# Patient Record
Sex: Female | Born: 1935 | Race: White | Hispanic: No | Marital: Married | Smoking: Never smoker
Health system: Southern US, Community
[De-identification: ages and names within clinical notes are randomized; demographics above are authoritative.]

## PROBLEM LIST (undated history)

## (undated) DIAGNOSIS — M549 Dorsalgia, unspecified: Secondary | ICD-10-CM

## (undated) DIAGNOSIS — C50919 Malignant neoplasm of unspecified site of unspecified female breast: Secondary | ICD-10-CM

## (undated) HISTORY — PX: PAIN PUMP IMPLANTATION: SHX330

## (undated) HISTORY — PX: APPENDECTOMY: SHX54

## (undated) HISTORY — PX: FOOT SURGERY: SHX648

## (undated) HISTORY — PX: PAIN PUMP REMOVAL: SHX6391

## (undated) HISTORY — PX: BREAST LUMPECTOMY: SHX2

---

## 2002-09-19 ENCOUNTER — Encounter: Admission: RE | Admit: 2002-09-19 | Discharge: 2002-09-19 | Payer: Self-pay | Admitting: Internal Medicine

## 2013-02-28 ENCOUNTER — Encounter (HOSPITAL_BASED_OUTPATIENT_CLINIC_OR_DEPARTMENT_OTHER): Payer: Self-pay | Admitting: Emergency Medicine

## 2013-02-28 ENCOUNTER — Emergency Department (HOSPITAL_BASED_OUTPATIENT_CLINIC_OR_DEPARTMENT_OTHER)
Admission: EM | Admit: 2013-02-28 | Discharge: 2013-02-28 | Disposition: A | Discharge status: Home / Self Care | Payer: Medicare Other | Attending: Emergency Medicine | Admitting: Emergency Medicine

## 2013-02-28 ENCOUNTER — Emergency Department (HOSPITAL_BASED_OUTPATIENT_CLINIC_OR_DEPARTMENT_OTHER): Payer: Medicare Other

## 2013-02-28 DIAGNOSIS — R1013 Epigastric pain: Secondary | ICD-10-CM | POA: Insufficient documentation

## 2013-02-28 DIAGNOSIS — M545 Low back pain, unspecified: Secondary | ICD-10-CM | POA: Insufficient documentation

## 2013-02-28 DIAGNOSIS — Z880 Allergy status to penicillin: Secondary | ICD-10-CM | POA: Insufficient documentation

## 2013-02-28 DIAGNOSIS — R1031 Right lower quadrant pain: Secondary | ICD-10-CM | POA: Insufficient documentation

## 2013-02-28 DIAGNOSIS — G8929 Other chronic pain: Secondary | ICD-10-CM | POA: Insufficient documentation

## 2013-02-28 DIAGNOSIS — Z79899 Other long term (current) drug therapy: Secondary | ICD-10-CM | POA: Diagnosis not present

## 2013-02-28 DIAGNOSIS — Z87828 Personal history of other (healed) physical injury and trauma: Secondary | ICD-10-CM | POA: Diagnosis not present

## 2013-02-28 DIAGNOSIS — Z792 Long term (current) use of antibiotics: Secondary | ICD-10-CM | POA: Diagnosis not present

## 2013-02-28 DIAGNOSIS — Z853 Personal history of malignant neoplasm of breast: Secondary | ICD-10-CM | POA: Insufficient documentation

## 2013-02-28 DIAGNOSIS — M549 Dorsalgia, unspecified: Secondary | ICD-10-CM

## 2013-02-28 DIAGNOSIS — K4090 Unilateral inguinal hernia, without obstruction or gangrene, not specified as recurrent: Secondary | ICD-10-CM | POA: Diagnosis not present

## 2013-02-28 DIAGNOSIS — R52 Pain, unspecified: Secondary | ICD-10-CM | POA: Insufficient documentation

## 2013-02-28 HISTORY — DX: Malignant neoplasm of unspecified site of unspecified female breast: C50.919

## 2013-02-28 HISTORY — DX: Dorsalgia, unspecified: M54.9

## 2013-02-28 LAB — CBC
HCT: 39.5 % (ref 36.0–46.0)
MCH: 30.2 pg (ref 26.0–34.0)
MCHC: 32.9 g/dL (ref 30.0–36.0)
Platelets: 158 10*3/uL (ref 150–400)
RDW: 13.5 % (ref 11.5–15.5)

## 2013-02-28 LAB — BASIC METABOLIC PANEL
BUN: 17 mg/dL (ref 6–23)
Creatinine, Ser: 0.7 mg/dL (ref 0.50–1.10)
GFR calc Af Amer: 90 mL/min — ABNORMAL LOW (ref >90)
GFR calc non Af Amer: 77 mL/min — ABNORMAL LOW (ref >90)
Glucose, Bld: 97 mg/dL (ref 70–99)
Potassium: 4.1 mEq/L (ref 3.5–5.1)
Sodium: 142 mEq/L (ref 135–145)

## 2013-02-28 LAB — HEPATIC FUNCTION PANEL
Albumin: 3.6 g/dL (ref 3.5–5.2)
Alkaline Phosphatase: 87 U/L (ref 39–117)
Total Bilirubin: 0.4 mg/dL (ref 0.3–1.2)
Total Protein: 7.1 g/dL (ref 6.0–8.3)

## 2013-02-28 LAB — URINE MICROSCOPIC-ADD ON

## 2013-02-28 LAB — URINALYSIS, ROUTINE W REFLEX MICROSCOPIC
Glucose, UA: NEGATIVE mg/dL
Leukocytes, UA: NEGATIVE
Protein, ur: NEGATIVE mg/dL
Specific Gravity, Urine: 1.018 (ref 1.005–1.030)
pH: 5.5 (ref 5.0–8.0)

## 2013-02-28 LAB — LIPASE, BLOOD: Lipase: 44 U/L (ref 11–59)

## 2013-02-28 MED ORDER — KETOROLAC TROMETHAMINE 30 MG/ML IJ SOLN
30.0000 mg | Freq: Once | INTRAMUSCULAR | Status: AC
  Administered 2013-02-28: 30 mg via INTRAVENOUS
  Filled 2013-02-28: qty 1

## 2013-02-28 MED ORDER — AZITHROMYCIN 250 MG PO TABS: ORAL_TABLET | ORAL | Status: AC

## 2013-02-28 MED ORDER — ETODOLAC 200 MG PO CAPS: 200.0000 mg | ORAL_CAPSULE | Freq: Three times a day (TID) | ORAL | Status: AC

## 2013-02-28 NOTE — ED Notes (Signed)
Patient took two advil last night & nothing this morning
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
## 2013-02-28 NOTE — ED Notes (Signed)
Patient refuses EKG at this time.  RN made aware.

## 2013-03-01 LAB — URINE CULTURE: Culture: NO GROWTH

## 2016-09-10 ENCOUNTER — Emergency Department (HOSPITAL_BASED_OUTPATIENT_CLINIC_OR_DEPARTMENT_OTHER): Payer: Medicare Other

## 2016-09-10 ENCOUNTER — Encounter (HOSPITAL_BASED_OUTPATIENT_CLINIC_OR_DEPARTMENT_OTHER): Payer: Self-pay | Admitting: Emergency Medicine

## 2016-09-10 ENCOUNTER — Emergency Department (HOSPITAL_BASED_OUTPATIENT_CLINIC_OR_DEPARTMENT_OTHER)
Admission: EM | Admit: 2016-09-10 | Discharge: 2016-09-10 | Disposition: A | Discharge status: Home / Self Care | Payer: Medicare Other | Attending: Emergency Medicine | Admitting: Emergency Medicine

## 2016-09-10 DIAGNOSIS — Y939 Activity, unspecified: Secondary | ICD-10-CM | POA: Insufficient documentation

## 2016-09-10 DIAGNOSIS — S60911A Unspecified superficial injury of right wrist, initial encounter: Secondary | ICD-10-CM | POA: Diagnosis present

## 2016-09-10 DIAGNOSIS — S61501A Unspecified open wound of right wrist, initial encounter: Secondary | ICD-10-CM | POA: Insufficient documentation

## 2016-09-10 DIAGNOSIS — Y999 Unspecified external cause status: Secondary | ICD-10-CM | POA: Diagnosis not present

## 2016-09-10 DIAGNOSIS — Z79899 Other long term (current) drug therapy: Secondary | ICD-10-CM | POA: Insufficient documentation

## 2016-09-10 DIAGNOSIS — Z23 Encounter for immunization: Secondary | ICD-10-CM | POA: Diagnosis not present

## 2016-09-10 DIAGNOSIS — Z853 Personal history of malignant neoplasm of breast: Secondary | ICD-10-CM | POA: Diagnosis not present

## 2016-09-10 DIAGNOSIS — S61511A Laceration without foreign body of right wrist, initial encounter: Secondary | ICD-10-CM

## 2016-09-10 DIAGNOSIS — W010XXA Fall on same level from slipping, tripping and stumbling without subsequent striking against object, initial encounter: Secondary | ICD-10-CM | POA: Insufficient documentation

## 2016-09-10 DIAGNOSIS — S39012A Strain of muscle, fascia and tendon of lower back, initial encounter: Secondary | ICD-10-CM | POA: Diagnosis not present

## 2016-09-10 DIAGNOSIS — W19XXXA Unspecified fall, initial encounter: Secondary | ICD-10-CM

## 2016-09-10 DIAGNOSIS — Y92010 Kitchen of single-family (private) house as the place of occurrence of the external cause: Secondary | ICD-10-CM | POA: Diagnosis not present

## 2016-09-10 MED ORDER — PENTAFLUOROPROP-TETRAFLUOROETH EX AERO
INHALATION_SPRAY | CUTANEOUS | Status: AC
  Administered 2016-09-10: 30
  Filled 2016-09-10: qty 30

## 2016-09-10 MED ORDER — TETANUS-DIPHTH-ACELL PERTUSSIS 5-2.5-18.5 LF-MCG/0.5 IM SUSP
0.5000 mL | Freq: Once | INTRAMUSCULAR | Status: AC
  Administered 2016-09-10: 0.5 mL via INTRAMUSCULAR
  Filled 2016-09-10: qty 0.5

## 2016-09-10 NOTE — ED Provider Notes (Signed)
[facility] [facility] Provider Note   CSN: [id number] Arrival date & time: 09/10/16  1044     History   Chief Complaint Chief Complaint  Patient presents with  . Fall    HPI Betty Wood is a 83 y.o. female.   Patient with fall at home as stumbled did not pass out. Did not hit her head. Fell on the tile floor in the kitchen. Complaint of back pain and a skin tear to her right wrist area. Also some right wrist pain. Patient has a history of prior compression fractures to the back. Tetanus is not up-to-date. Patient does have a walker at home to use.      Past Medical History:  Diagnosis Date  . Back pain   . Breast cancer ([place])     There are no active problems to display for this patient.   Past Surgical History:  Procedure Laterality Date  . APPENDECTOMY    . BREAST LUMPECTOMY    . FOOT SURGERY    . PAIN PUMP IMPLANTATION    . PAIN PUMP REMOVAL      OB History    No data available       Home Medications    Prior to Admission medications   Medication Sig Start Date End Date Taking? Authorizing Provider  Alum & Mag Hydroxide-Simeth (MAGIC MOUTHWASH) SOLN Take 5 mLs by mouth.    [provider]  azithromycin (ZITHROMAX Z-PAK) 250 MG tablet 2 po day one, then 1 daily x 4 days 02/28/13   Evelina Bucy, MD  Calcium Carbonate-Vitamin D (CALCIUM-D PO) Take by mouth.    [provider]  estrogens, conjugated, (PREMARIN) 0.3 MG tablet Take 0.3 mg by mouth daily. Take daily for 21 days then do not take for 7 days.    [provider]  etodolac (LODINE) 200 MG capsule Take 1 capsule (200 mg total) by mouth 3 (three) times daily. 02/28/13   Evelina Bucy, MD  levothyroxine (SYNTHROID, LEVOTHROID) 50 MCG tablet Take 50 mcg by mouth daily before breakfast.    [provider]  nystatin 100000 UNITS vaginal tablet Place 1 tablet vaginally at bedtime.    [provider]  NYSTATIN PO Take by mouth.    [provider]    omeprazole (PRILOSEC) 20 MG capsule Take 20 mg by mouth daily.    [provider]  oxaprozin (DAYPRO) 600 MG tablet Take by mouth daily.    [provider]  oxybutynin (DITROPAN-XL) 10 MG 24 hr tablet Take 10 mg by mouth at bedtime.    [provider]  tobramycin (TOBREX) 0.3 % ophthalmic ointment 1 application 3 (three) times daily.    [provider]  vitamin B-12 (CYANOCOBALAMIN) 1000 MCG tablet Take 1,000 mcg by mouth daily.    [provider]  vitamin E 1000 UNIT capsule Take 1,000 Units by mouth daily.    [provider]    Family History No family history on file.  Social History Social History  Substance Use Topics  . Smoking status: Never Smoker  . Smokeless tobacco: Never Used  . Alcohol use No     Allergies   Bextra [valdecoxib]; Chloromycetin [chloramphenicol]; Clindamycin/lincomycin; Decadrol [dexamethasone]; Dilaudid [hydromorphone hcl]; Dimetapp cold-allergy [brompheniramine-phenylephrine]; Doxycycline; Erythromycin; Flagyl [metronidazole]; Hydrocodone; Indomethacin; Influenza vaccines; Ivp dye [iodinated diagnostic agents]; Keflex [cephalexin]; Levaquin [levofloxacin in d5w]; Mobic [meloxicam]; Pneumovax [pneumococcal polysaccharide vaccine]; Shellfish allergy; Sulfa antibiotics; Titanium; Triaminic fever reducer [acetaminophen]; and Vioxx [rofecoxib]   Review of Systems  Review of Systems  Constitutional: Negative for fever.  HENT: Negative for congestion.   Eyes: Negative for redness.  Respiratory: Negative for shortness of breath.   Cardiovascular: Negative for chest pain.  Gastrointestinal: Negative for abdominal pain.  Genitourinary: Negative for dysuria.  Musculoskeletal: Positive for back pain. Negative for neck pain.  Skin: Positive for wound.  Neurological: Negative for headaches.  Hematological: Does not bruise/bleed easily.  Psychiatric/Behavioral: Negative for confusion.     Physical  Exam Updated Vital Signs BP (!) 147/67 (BP Location: Right Arm)   Pulse 83   Resp 17   SpO2 98%   Physical Exam  Constitutional: She is oriented to person, place, and time. She appears well-developed and well-nourished.  HENT:  Head: Normocephalic and atraumatic.  Mouth/Throat: Oropharynx is clear and moist.  Eyes: Conjunctivae and EOM are normal. Pupils are equal, round, and reactive to light.  Neck: Normal range of motion. Neck supple.  Cardiovascular: Normal rate, regular rhythm and normal heart sounds.   Pulmonary/Chest: Effort normal and breath sounds normal. No respiratory distress.  Abdominal: Soft. Bowel sounds are normal. There is no tenderness.  Musculoskeletal: Normal range of motion. She exhibits tenderness.  No tenderness with range of motion of legs or palpation of the hips or pelvis. But that does cause some back pain. Right wrist with a 4 x 3 cm skin tear. Some tenderness to the wrist area. No elbow or shoulder tenderness. Cap refill normal. Radial pulses 2+.  Neurological: She is alert and oriented to person, place, and time. No cranial nerve deficit or sensory deficit. She exhibits normal muscle tone. Coordination normal.  Skin: Skin is warm.  Nursing note and vitals reviewed.    ED Treatments / Results  Labs (all labs ordered are listed, but only abnormal results are displayed) Labs Reviewed - No data to display  EKG  EKG Interpretation None       Radiology Dg Wrist Complete Right  Result Date: 09/10/2016 CLINICAL DATA:  Status post fall last night.  Right wrist pain. EXAM: RIGHT WRIST - COMPLETE 3+ VIEW COMPARISON:  None. FINDINGS: Severe osteopenia. No acute fracture or dislocation. Mild radiocarpal joint space narrowing. Severe osteoarthritis of the scaphotrapeziotrapezoid joint. Severe joint space narrowing of the lunocapitate joint. Chondrocalcinosis of the TFCC as can be seen with CPPD. IMPRESSION: 1. No acute osseous injury of the right wrist. Given  the patient's age and osteopenia, if there is persistent clinical concern for an occult fracture, a follow-up wrist x-ray and 7-10 days or MRI is recommended for increased sensitivity. Electronically Signed   By: Kathreen Devoid   On: 09/10/2016 12:34   Ct Lumbar Spine Wo Contrast  Result Date: 09/10/2016 CLINICAL DATA:  Buttock pain secondary to a fall last night. Bold compression fractures of L2, L3, and L4, treated with vertebroplasty. Congenital fusion of T12 and L1. EXAM: CT LUMBAR SPINE WITHOUT CONTRAST TECHNIQUE: Multidetector CT imaging of the lumbar spine was performed without intravenous contrast administration. Multiplanar CT image reconstructions were also generated. COMPARISON:  Lumbar MRI dated 04/12/2016 FINDINGS: Segmentation: There is congenital fusion of the T12 and L1 vertebral bodies. Alignment: Lumbar scoliosis. Chronic grade 1 spondylolisthesis at L4-5. Vertebrae: Old compression fractures of L2, L3, L4, treated with vertebroplasty. No acute fractures of the lumbar spine. No appreciable change since the prior MRI. Paraspinal and other soft tissues: No acute abnormalities. Aortic atherosclerosis. Disc levels: T11-12: Disc space narrowing with disc degeneration with a vacuum phenomena and. Small broad-based calcified disc bulge with no  neural impingement. Severe left foraminal stenosis, unchanged. 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 No acute abnormalities. L1-2: Severe disc disease with a prominent vacuum phenomena and. Minimal broad-based disc bulge with no neural impingement. No acute abnormality. Previous L2 vertebroplasty with old healed compression fracture of the superior endplate. L2-3: Severe disc degeneration with a vacuum phenomena and. Broad-based disc bulge without neural impingement. Old compression fracture of L3 treated with vertebroplasty. No acute abnormality. Small amount of methylmethacrylate in the spinal canal adjacent to the thecal sac without neural  impingement. Moderate right foraminal stenosis, unchanged. L3-4: Severe degenerative disc disease with a vacuum phenomena and. Broad-based small protrusion of the uncovered disc. Old compression fracture of L4 treated with vertebroplasty. Small amount of methylmethacrylate in the spinal canal without neural impingement. This is unchanged. L4-5: Grade 1 spondylolisthesis with severe degenerative disc disease with a broad-based protrusion of the uncovered disc with severe right foraminal stenosis, unchanged. Moderate bilateral facet arthritis. Moderate spinal stenosis, unchanged. L5-S1: Chronic severe degenerative disc disease with disc space narrowing and a vacuum phenomena and. No disc protrusion or fracture or other acute abnormality. Slight bilateral facet arthritis. IMPRESSION: 1. No acute abnormality of the lumbar spine. 2. Diffuse degenerative disc disease and joint disease throughout the lumbar spine. 3. Old treated compression fractures of L2 through L4. 4. Congenital fusion of the T12-L1 vertebra. Electronically Signed   By: Lorriane Shire M.D.   On: 09/10/2016 13:18   Dg Hips Bilat W Or Wo Pelvis 3-4 Views  Result Date: 09/10/2016 CLINICAL DATA:  Pt fell last night and she is having pain rt wrist and low back pain,no hip pain EXAM: DG HIP (WITH OR WITHOUT PELVIS) 3-4V BILAT COMPARISON:  None. FINDINGS: AP views of the pelvis and both hips. Lower lumbar vertebral augmentation. Osteopenia. Deformities of the parasymphyseal region on the left are likely related to remote trauma. Femoral heads are located. No convincing evidence of acute fracture. IMPRESSION: Osteopenia, without acute osseous abnormality. Given the extent of osteopenia, if high clinical concern of acute injury, the test of choice is MRI. Deformities in the parasymphyseal region of the left hemipelvis are most consistent with remote fractures. Electronically Signed   By: Abigail Miyamoto M.D.   On: 09/10/2016 12:38    Procedures Procedures  (including critical care time)  Medications Ordered in ED Medications  Tdap (BOOSTRIX) injection 0.5 mL (0.5 mLs Intramuscular Given 09/10/16 1324)  pentafluoroprop-tetrafluoroeth (GEBAUERS) aerosol (30 application  Given 7/40/81 1328)     Initial Impression / Assessment and Plan / ED Course  I have reviewed the triage vital signs and the nursing notes.  Pertinent labs & imaging results that were available during my care of the patient were reviewed by me and considered in my medical decision making (see chart for details).     tetanus updated here. X-rays of the right wrist without any bony injuries. But occult fracture of the scaphoid area is not completely ruled out. Patient has pretty good range of motion. Patient cautioned that if not improved over the next 7-14 days will require follow-up x-ray which can be done by her primary care doctor.  Skin tear wound care addressed.   X-ray of the lumbar back without any acute findings. X-rays of bilateral hips and pelvis without any acute findings.   Patient states she does not require any pain medicine. Patient  Nontoxic no acute distress.   Final Clinical Impressions(s) / ED Diagnoses   Final diagnoses:  Fall, initial encounter  Lumbar strain, initial encounter  Tear of skin of right wrist, initial encounter    New Prescriptions New Prescriptions   No medications on file     Fredia Sorrow, MD 09/10/16 [phone]

## 2016-09-10 NOTE — ED Triage Notes (Signed)
Pt fell at home in the kitchen.  Pt c/o back pain.  Pt also has skin tear/abrasion to right wrist area.  Pt would not let me assess area well due to painful removing of shirt.

## 2016-09-10 NOTE — ED Notes (Signed)
Wound to left wrist irrigated with sterile water, bacitracin, mepitel and guaze applied.

## 2016-09-10 NOTE — Discharge Instructions (Signed)
Wound care to the right wrist skin tear. Keep dressing in place for the next 2 days then remove and apply antibiotic ointment nonadherent dressing and rewrap. Make an appointment to follow-up with your record Dr. For recheck of the wounds. Today's CT of the back without any acute findings. X-ray of the pelvis without any acute findings. X-ray of the right wrist without any acute findings. If the right wrist continues to hurt follow-up x-ray in of 7-14 days would be appropriate  To rule out an occult fracture.

## 2016-09-10 NOTE — ED Notes (Signed)
Pt able to stand and pivot to lowered bed via x2man min assist

## 2017-10-12 IMAGING — DX DG HIP (WITH OR WITHOUT PELVIS) 3-4V BILAT
5 series · 5 of 5 positions shown · non-contrast
Comparison: None.

CLINICAL DATA: Pt fell last night and she is having pain rt wrist
and low back pain,no hip pain

EXAM:
DG HIP (WITH OR WITHOUT PELVIS) 3-4V BILAT

[pelvis ap]
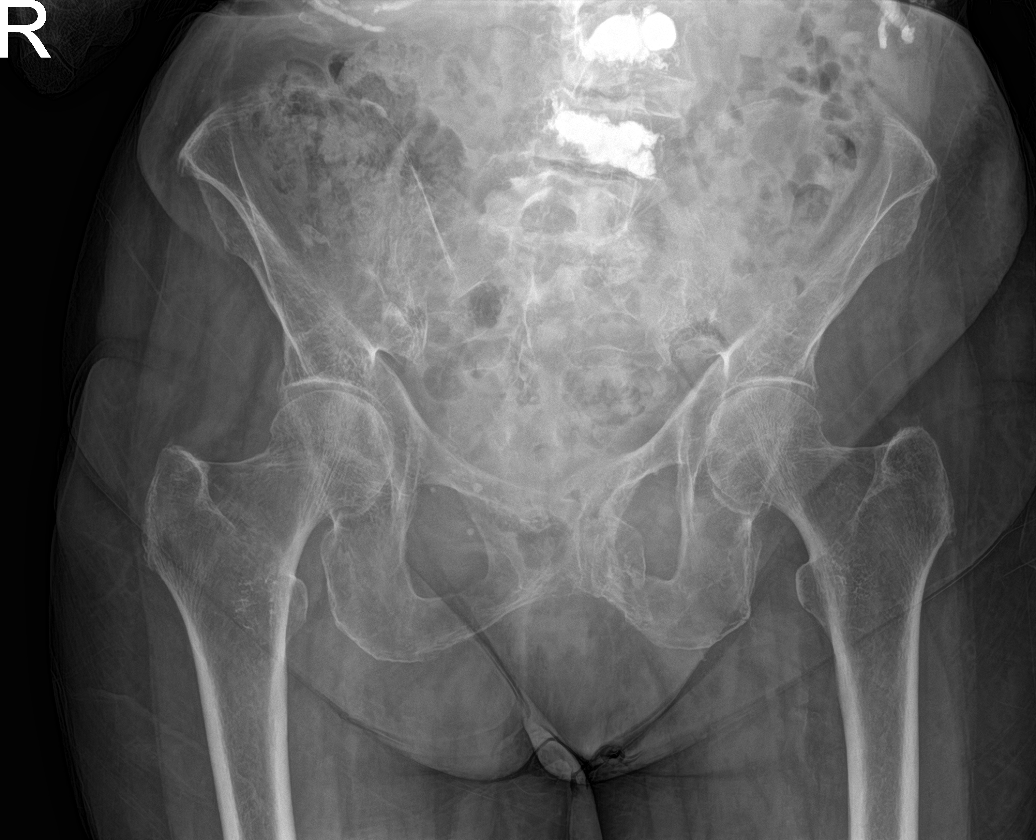

[hip ap (1 of 2)]
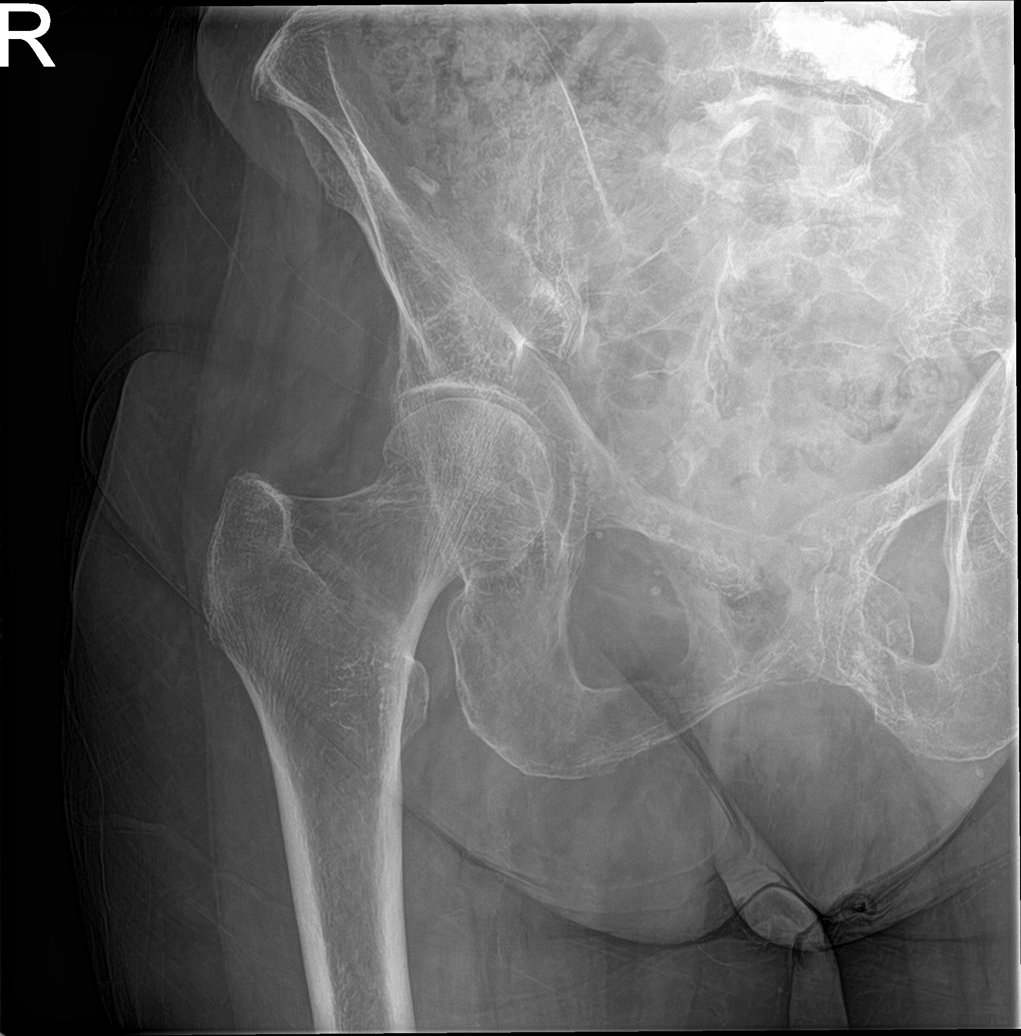

[hip lat (1 of 2)]
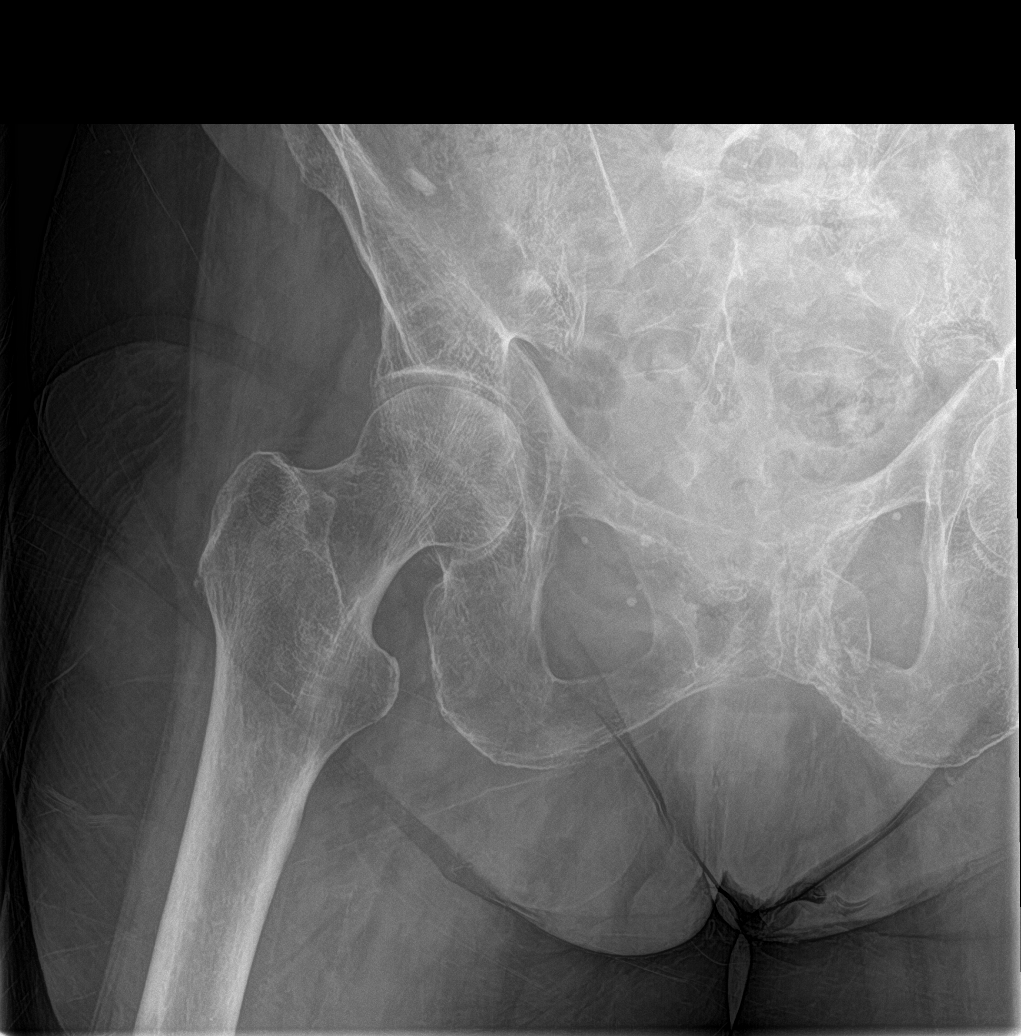

[hip ap (2 of 2)]
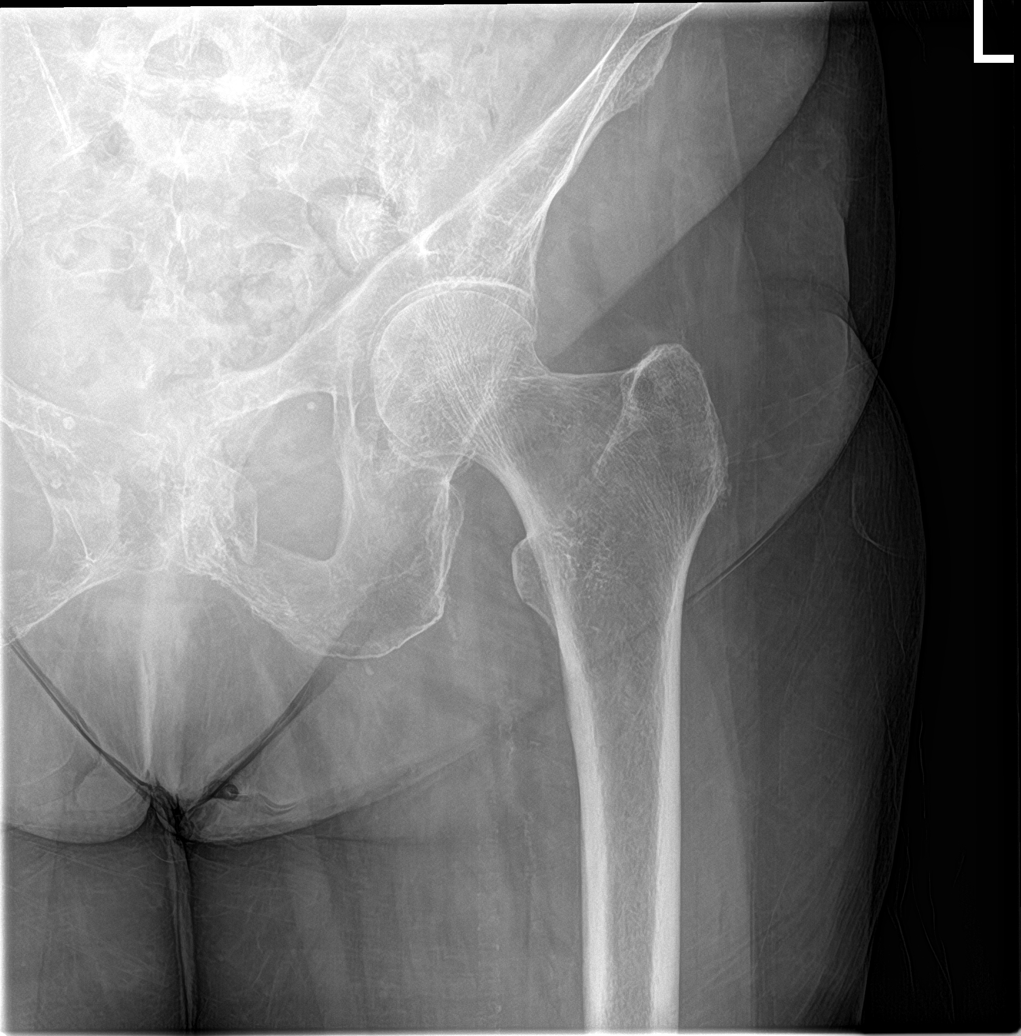

[hip lat (2 of 2)]
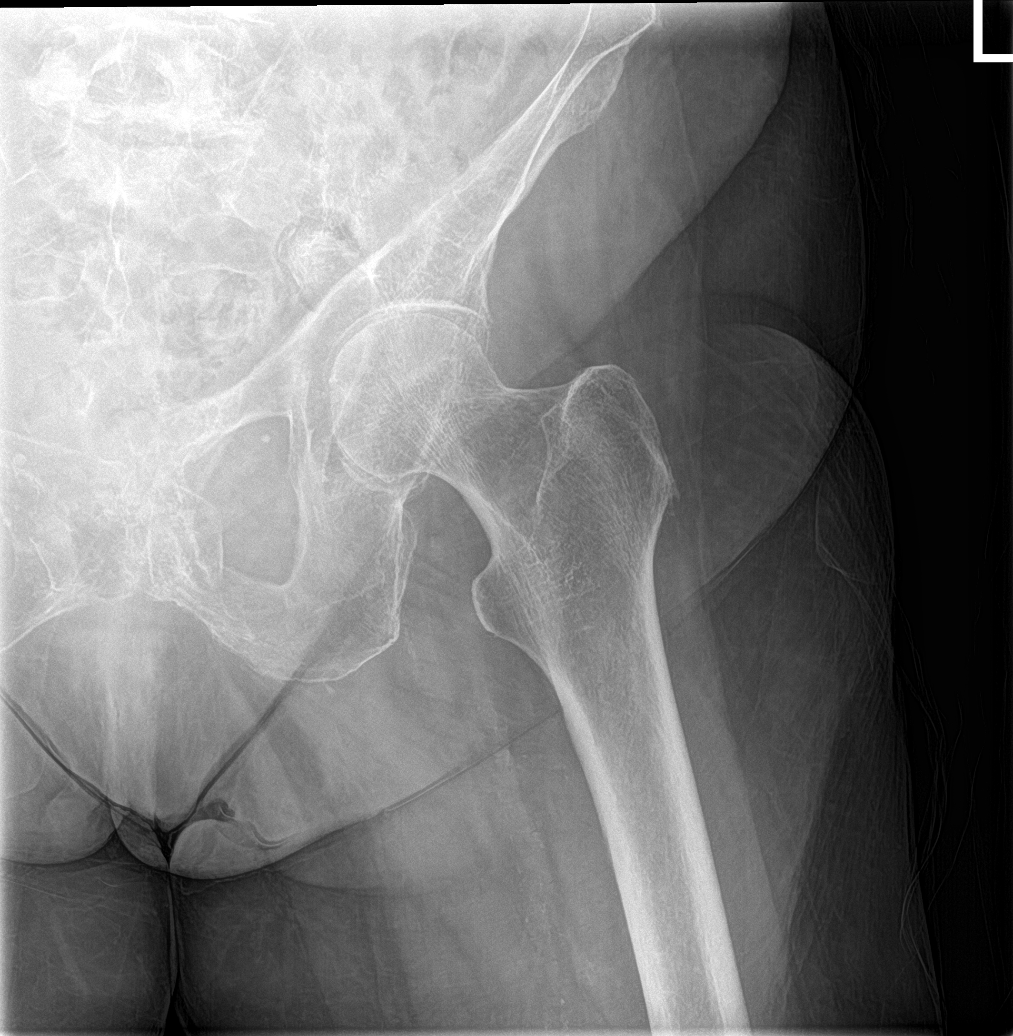

[5 of 5 positions shown; findings below may reference images not displayed]

FINDINGS: AP views of the pelvis and both hips. Lower lumbar vertebral
augmentation. Osteopenia. Deformities of the parasymphyseal region
on the left are likely related to remote trauma. Femoral heads are
located. No convincing evidence of acute fracture.
IMPRESSION: Osteopenia, without acute osseous abnormality.

Given the extent of osteopenia, if high clinical concern of acute
injury, the test of choice is MRI.

Deformities in the parasymphyseal region of the left hemipelvis are
most consistent with remote fractures.

## 2018-06-28 ENCOUNTER — Other Ambulatory Visit: Payer: Self-pay | Admitting: Sports Medicine

## 2018-06-28 DIAGNOSIS — S32000A Wedge compression fracture of unspecified lumbar vertebra, initial encounter for closed fracture: Secondary | ICD-10-CM

## 2018-06-28 DIAGNOSIS — S22000A Wedge compression fracture of unspecified thoracic vertebra, initial encounter for closed fracture: Secondary | ICD-10-CM

## 2018-10-25 DEATH — deceased
